# Patient Record
Sex: Male | Born: 1995 | Race: White | Hispanic: Yes | Marital: Single | Smoking: Never smoker
Health system: Southern US, Community
[De-identification: ages and names within clinical notes are randomized; demographics above are authoritative.]

---
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
## 2019-04-29 ENCOUNTER — Encounter (HOSPITAL_COMMUNITY): Payer: Self-pay

## 2019-04-29 ENCOUNTER — Other Ambulatory Visit: Payer: Self-pay

## 2019-04-29 ENCOUNTER — Emergency Department (HOSPITAL_COMMUNITY)
Admission: EM | Admit: 2019-04-29 | Discharge: 2019-04-29 | Disposition: A | Discharge status: Home / Self Care | Payer: Self-pay | Attending: Emergency Medicine | Admitting: Emergency Medicine

## 2019-04-29 DIAGNOSIS — B349 Viral infection, unspecified: Secondary | ICD-10-CM | POA: Insufficient documentation

## 2019-04-29 DIAGNOSIS — Z0279 Encounter for issue of other medical certificate: Secondary | ICD-10-CM | POA: Insufficient documentation

## 2019-04-29 NOTE — ED Triage Notes (Signed)
Pt reports not feeling well yesterday, nausea and body aches, but is better now  Called out from work and needs a note to go back

## 2019-04-29 NOTE — Discharge Instructions (Addendum)
Your symptoms are likely related to a viral illness, but you have no clinical symptoms at present to indicate that you have COVID-19.  You should continue to observe the basic precautions which include frequent handwashing, avoid touching your face and observe social distancing.  It's also important to wear a face mask when in public.  Monitor your temperature daily and stay home if you develop fever, cough or shortness of breath.  Return to ER for any worsening symptoms

## 2019-04-29 NOTE — ED Provider Notes (Signed)
[facility] EMERGENCY DEPARTMENT Provider Note   CSN: [id number] Arrival date & time: 04/29/19  1325    History   Chief Complaint Chief Complaint  Patient presents with  . work note    HPI Ricardo Cox is a 23 y.o. male.     HPI   Ricardo Cox is a 23 y.o. male who presents to the Emergency Department requesting a note to return to work.  He states that he woke up yesterday and today with nausea, generalized body aches and malaise.  Symptoms resolved after a few hours.  Unclear if he had a fever, states that he called out of work and was advised that he needed a note to return to work.  His brother is also here for similar symptoms.  He denies any symptoms at present.  He denies cough, nasal congestion, sore throat, shortness of breath, abdominal pain, vomiting or diarrhea.  He has not taken any medications and symptoms resolved spontaneously.    History reviewed. No pertinent past medical history.  There are no active problems to display for this patient.   History reviewed. No pertinent surgical history.      Home Medications    Prior to Admission medications   Not on File    Family History No family history on file.  Social History Social History   Tobacco Use  . Smoking status: Never Smoker  Substance Use Topics  . Alcohol use: Not Currently  . Drug use: Not Currently     Allergies   Patient has no known allergies.   Review of Systems Review of Systems  Constitutional: Negative for activity change, appetite change, chills, fatigue and fever.  Respiratory: Negative for cough, shortness of breath and wheezing.   Cardiovascular: Negative for chest pain.  Gastrointestinal: Positive for nausea. Negative for abdominal pain and vomiting.  Genitourinary: Negative for dysuria and flank pain.  Musculoskeletal: Positive for myalgias. Negative for arthralgias, back pain, neck pain and neck stiffness.  Skin: Negative for rash.  Neurological:  Negative for dizziness, weakness and numbness.  Hematological: Does not bruise/bleed easily.     Physical Exam Updated Vital Signs BP 139/81 (BP Location: Right Arm)   Pulse 91   Temp 98.2 F (36.8 C) (Oral)   Resp 15   SpO2 99%   Physical Exam Vitals signs and nursing note reviewed.  Constitutional:      Appearance: Normal appearance. He is not ill-appearing or toxic-appearing.  HENT:     Right Ear: Tympanic membrane and ear canal normal.     Left Ear: Tympanic membrane and ear canal normal.     Mouth/Throat:     Mouth: Mucous membranes are moist.     Pharynx: Oropharynx is clear. No oropharyngeal exudate or posterior oropharyngeal erythema.  Neck:     Musculoskeletal: Full passive range of motion without pain and normal range of motion.     Meningeal: Kernig's sign absent.  Cardiovascular:     Rate and Rhythm: Normal rate and regular rhythm.     Pulses: Normal pulses.  Pulmonary:     Effort: Pulmonary effort is normal.     Breath sounds: Normal breath sounds.  Abdominal:     General: There is no distension.     Palpations: Abdomen is soft.     Tenderness: There is no abdominal tenderness.  Musculoskeletal: Normal range of motion.  Skin:    General: Skin is warm.     Findings: No rash.  Neurological:  General: No focal deficit present.     Mental Status: He is alert.     Sensory: No sensory deficit.     Motor: No weakness.      ED Treatments / Results  Labs (all labs ordered are listed, but only abnormal results are displayed) Labs Reviewed - No data to display  EKG None  Radiology No results found.  Procedures Procedures (including critical care time)  Medications Ordered in ED Medications - No data to display   Initial Impression / Assessment and Plan / ED Course  I have reviewed the triage vital signs and the nursing notes.  Pertinent labs & imaging results that were available during my care of the patient were reviewed by me and  considered in my medical decision making (see chart for details).        Well appearing.  Vitals reviewed.  Pt here for note to return to work.  No reported fever, cough or dyspnea.  Afebrile here.  Viral process suspected, but clinically I am doubtful this is COVID.  Work note provided.  Pt given instructions to adhere to social distancing, handwashing precautions, and wearing a face mask in public.  Return precautions discussed.    Final Clinical Impressions(s) / ED Diagnoses   Final diagnoses:  Viral illness    ED Discharge Orders    None       Pauline Aus, PA-C 04/29/19 1429    Raeford Razor, MD 04/29/19 1434

## 2019-04-29 NOTE — ED Notes (Signed)
ED Provider at bedside. 

## 2019-04-29 NOTE — ED Notes (Signed)
Pt reports symptoms of nausea and body aches yesterday but denies any symptoms since then. Denies pain.

## 2019-07-15 ENCOUNTER — Other Ambulatory Visit: Payer: Self-pay

## 2019-07-15 DIAGNOSIS — Z20822 Contact with and (suspected) exposure to covid-19: Secondary | ICD-10-CM

## 2019-07-17 LAB — NOVEL CORONAVIRUS, NAA: SARS-CoV-2, NAA: NOT DETECTED

## 2024-08-17 ENCOUNTER — Ambulatory Visit
Admission: EM | Admit: 2024-08-17 | Discharge: 2024-08-17 | Disposition: A | Discharge status: Home / Self Care | Payer: Self-pay | Attending: Family Medicine | Admitting: Family Medicine

## 2024-08-17 DIAGNOSIS — L509 Urticaria, unspecified: Secondary | ICD-10-CM

## 2024-08-17 DIAGNOSIS — W57XXXA Bitten or stung by nonvenomous insect and other nonvenomous arthropods, initial encounter: Secondary | ICD-10-CM

## 2024-08-17 NOTE — ED Triage Notes (Addendum)
 Pt reports tick bite left flank  x 1 mo ago. Wednesday started with congestion and cough, body aches. Thursday a rash appeared, has been using eczema cream, allegra and benadryl to help with the itchiness of the rash but is has not cleared up.

## 2024-08-17 NOTE — Discharge Instructions (Signed)
 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  We have sent out some blood for tickborne illnesses as well as alpha gal today and I recommend taking an antihistamine such as Allegra or Zyrtec once to twice daily consistently and avoiding any potential triggers such as those listed in the handout until we can get your blood work results back.  In order to establish care with a primary care provider, you may go to the [facility] health website and click on the find a provider tab.  From here you can find providers who are taking new patients and even schedule your new patient appointment.  Follow-up for worsening or unresolving symptoms.

## 2024-08-18 NOTE — ED Provider Notes (Signed)
 [facility]    CSN: [id number] Arrival date & time: 08/17/24  9180      History   Chief Complaint No chief complaint on file.   HPI Ricardo Cox is a 28 y.o. male.   Patient presenting today following a tick bite that occurred about a week ago, is now having congestion, cough, body aches, and hives rashes that he notes over the last few days to be worse after eating.  He states he eats a lot of meat products especially beef and starts having lip tingling and itching/hives following meals particularly with this.  Denies throat itching or swelling, chest tightness, shortness of breath, vomiting.  He has been trying his eczema cream on the rash as well as Allegra and Benadryl with minimal relief.  He notes he has had multiple tick bites this summer but the most recent was the one about a week ago.    History reviewed. No pertinent past medical history.  There are no active problems to display for this patient.   History reviewed. No pertinent surgical history.     Home Medications    Prior to Admission medications   Not on File    Family History History reviewed. No pertinent family history.  Social History Social History   Tobacco Use   Smoking status: Never  Substance Use Topics   Alcohol use: Not Currently   Drug use: Not Currently     Allergies   Patient has no known allergies.   Review of Systems Review of Systems Per HPI  Physical Exam Triage Vital Signs ED Triage Vitals  Encounter Vitals Group     BP 08/17/24 0824 125/74     Girls Systolic BP Percentile --      Girls Diastolic BP Percentile --      Boys Systolic BP Percentile --      Boys Diastolic BP Percentile --      Pulse Rate 08/17/24 0824 (!) 125     Resp 08/17/24 0824 20     Temp 08/17/24 0824 97.6 F (36.4 C)     Temp Source 08/17/24 0824 Oral     SpO2 08/17/24 0824 96 %     Weight --      Height --      Head Circumference --      Peak Flow --      Pain  Score 08/17/24 0829 0     Pain Loc --      Pain Education --      Exclude from Growth Chart --    No data found.  Updated Vital Signs BP 125/74 (BP Location: Right Arm)   Pulse (!) 125   Temp 97.6 F (36.4 C) (Oral)   Resp 20   SpO2 96%   Visual Acuity Right Eye Distance:   Left Eye Distance:   Bilateral Distance:    Right Eye Near:   Left Eye Near:    Bilateral Near:     Physical Exam Vitals and nursing note reviewed.  Constitutional:      Appearance: Normal appearance.  HENT:     Head: Atraumatic.     Nose: Nose normal.     Mouth/Throat:     Mouth: Mucous membranes are moist.     Pharynx: Oropharynx is clear. No posterior oropharyngeal erythema.  Eyes:     Extraocular Movements: Extraocular movements intact.     Conjunctiva/sclera: Conjunctivae normal.  Cardiovascular:     Rate and Rhythm: Normal rate  and regular rhythm.  Pulmonary:     Effort: Pulmonary effort is normal.     Breath sounds: Normal breath sounds. No wheezing or rales.  Musculoskeletal:        General: Normal range of motion.     Cervical back: Normal range of motion and neck supple.  Skin:    General: Skin is warm and dry.     Findings: Rash present.     Comments: Trace hives visualized to the right neck region  Neurological:     General: No focal deficit present.     Mental Status: He is oriented to person, place, and time.  Psychiatric:        Mood and Affect: Mood normal.        Thought Content: Thought content normal.        Judgment: Judgment normal.      UC Treatments / Results  Labs (all labs ordered are listed, but only abnormal results are displayed) Labs Reviewed  ALPHA GALACTOSIDASE  SPOTTED FEVER GROUP ANTIBODIES  LYME DISEASE SEROLOGY W/REFLEX    EKG   Radiology No results found.  Procedures Procedures (including critical care time)  Medications Ordered in UC Medications - No data to display  Initial Impression / Assessment and Plan / UC Course  I have  reviewed the triage vital signs and the nursing notes.  Pertinent labs & imaging results that were available during my care of the patient were reviewed by me and considered in my medical decision making (see chart for details).     Tachycardic in triage, otherwise vital signs and exam very reassuring.  Concerning features for alpha gal allergy development.  Will also rule out [facility] spotted fever and Lyme.  Discussed avoiding potential alpha-gal triggers in the meantime and continuing antihistamines once to twice daily to keep hives at bay.  Strict return precautions given for worsening symptoms. 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  We have sent out some blood for tickborne illnesses as well as alpha gal today and I recommend taking an antihistamine such as Allegra or Zyrtec once to twice daily consistently and avoiding any potential triggers such as those listed in the handout until we can get your blood work results back.  In order to establish care with a primary care provider, you may go to the [facility] health website and click on the find a provider tab.  From here you can find providers who are taking new patients and even schedule your new patient appointment.  Follow-up for worsening or unresolving symptoms.     ED Prescriptions   None    PDMP not reviewed this encounter.   Stuart Vernell Norris, [place] 08/18/24 1341

## 2024-08-19 LAB — SPOTTED FEVER GROUP ANTIBODIES
Spotted Fever Group IgG: <1:64 {titer}
Spotted Fever Group IgM: <1:64 {titer}

## 2024-08-19 LAB — SPECIMEN STATUS REPORT

## 2024-08-19 LAB — LYME DISEASE SEROLOGY W/REFLEX: Lyme Total Antibody EIA: NEGATIVE

## 2024-08-19 LAB — ALPHA GALACTOSIDASE

## 2024-08-21 ENCOUNTER — Telehealth: Payer: Self-pay | Admitting: Family Medicine

## 2024-08-21 ENCOUNTER — Telehealth: Payer: Self-pay

## 2024-08-21 DIAGNOSIS — L509 Urticaria, unspecified: Secondary | ICD-10-CM

## 2024-08-21 DIAGNOSIS — W57XXXA Bitten or stung by nonvenomous insect and other nonvenomous arthropods, initial encounter: Secondary | ICD-10-CM

## 2024-08-21 NOTE — Telephone Encounter (Signed)
 Alpha gal testing was apparently canceled at the lab despite being drawn into the tube per website recommendations.  Order placed as a future order to be done at the [facility] draw station and patient will be notified to go to a draw station to get the labs pulled.

## 2024-08-21 NOTE — Telephone Encounter (Signed)
 First attempt to contact pt with lab results and recommendations by provider. Pt did not answer.

## 2024-08-21 NOTE — Telephone Encounter (Signed)
 Pt returned call and was informed of lab results. Told pt was test was canceled via [facility] and he would need to have that lab redrawn, Was instructed to go to [facility] to have test completed. Order has been placed for recollection of the test.

## 2024-08-24 LAB — ALPHA-GAL PANEL
Allergen Lamb IgE: <0.1 kU/L
Beef IgE: <0.1 kU/L
IgE (Immunoglobulin E), Serum: 233 [IU]/mL (ref 6–495)
O215-IgE Alpha-Gal: <0.1 kU/L
Pork IgE: <0.1 kU/L
